# Patient Record
Sex: Female | Born: 2000 | Race: Black or African American | Hispanic: No | Marital: Single | Smoking: Never smoker
Health system: Southern US, Community
[De-identification: ages and names within clinical notes are randomized; demographics above are authoritative.]

---

## 2021-01-05 ENCOUNTER — Emergency Department (HOSPITAL_COMMUNITY): Payer: No Typology Code available for payment source

## 2021-01-05 ENCOUNTER — Emergency Department (HOSPITAL_COMMUNITY)
Admission: EM | Admit: 2021-01-05 | Discharge: 2021-01-05 | Disposition: A | Discharge status: Home / Self Care | Payer: No Typology Code available for payment source | Attending: Emergency Medicine | Admitting: Emergency Medicine

## 2021-01-05 DIAGNOSIS — Y9289 Other specified places as the place of occurrence of the external cause: Secondary | ICD-10-CM | POA: Insufficient documentation

## 2021-01-05 DIAGNOSIS — M542 Cervicalgia: Secondary | ICD-10-CM | POA: Diagnosis not present

## 2021-01-05 DIAGNOSIS — S060X0A Concussion without loss of consciousness, initial encounter: Secondary | ICD-10-CM | POA: Diagnosis not present

## 2021-01-05 DIAGNOSIS — S0990XA Unspecified injury of head, initial encounter: Secondary | ICD-10-CM

## 2021-01-05 DIAGNOSIS — Y990 Civilian activity done for income or pay: Secondary | ICD-10-CM | POA: Insufficient documentation

## 2021-01-05 DIAGNOSIS — Y9389 Activity, other specified: Secondary | ICD-10-CM | POA: Insufficient documentation

## 2021-01-05 DIAGNOSIS — W01198A Fall on same level from slipping, tripping and stumbling with subsequent striking against other object, initial encounter: Secondary | ICD-10-CM | POA: Insufficient documentation

## 2021-01-05 LAB — POC URINE PREG, ED: Preg Test, Ur: NEGATIVE

## 2021-01-05 MED ORDER — ACETAMINOPHEN 500 MG PO TABS: 1000.0000 mg | ORAL_TABLET | Freq: Once | ORAL | Status: DC

## 2021-01-05 NOTE — Discharge Instructions (Signed)
You have been evaluated for your head injury.  Fortunately CT scan and Xray did not show any broken bones or blood in your brain.  You do have symptoms concerning for a concussion.  Please follow instruction below.  You may take over the counter tylenol or ibuprofen as needed for pain. Return if you have any concerns.

## 2021-01-05 NOTE — ED Triage Notes (Signed)
Pt c/o continued HA, back & neck pain after being hit in the back of the head by approx 90lb crate. Pt denies LOC, endorses nausea & 1 episode of emesis PTA, photosensitivity, sensitive to sound.  10/10 HA

## 2021-01-05 NOTE — ED Provider Notes (Signed)
Emergency Medicine Provider Triage Evaluation Note  Kayla Clay , a 20 y.o. female  was evaluated in triage.  Pt complains of headache.  Review of Systems  Positive: Headache, n/v, light&sound sensitivity, neck pain Negative: Fever, numbness, weaknesss  Physical Exam  BP 135/84 (BP Location: Right Arm)   Pulse 74   Temp 98.2 F (36.8 C) (Oral)   Resp 18   SpO2 100%  Gen:   Awake, no distress   Resp:  Normal effort  MSK:   Moves extremities without difficulty  Other:  TTP to R parietal region, no bruising or crepitus.  Ttp to R paracervical spinal muscle  Medical Decision Making  Medically screening exam initiated at 1:33 AM.  Appropriate orders placed.  [facility] was informed that the remainder of the evaluation will be completed by another provider, this initial triage assessment does not replace that evaluation, and the importance of remaining in the ED until their evaluation is complete.  Report being struck in the head with a falling crate approximately 90lbs.  It was a glance to the R side of head while at work and her supervisor was able to catch it from falling.  However endorse 10/10 R side head/neck pain, nausea, one episode of emesis along with light/sound sensitivity.     Fayrene Helper, PA-C 01/05/21 0141    Zadie Rhine, MD 01/05/21 [phone]

## 2021-01-05 NOTE — ED Provider Notes (Signed)
[facility] EMERGENCY DEPARTMENT Provider Note   CSN: [id number] Arrival date & time: 01/05/21  0120     History Chief Complaint  Patient presents with   Head Injury    [facility] Spillers is a 20 y.o. female.  The history is provided by the patient. No language interpreter was used.  Head Injury   20 year old female presenting for evaluation of head injury.  Pt report earlier in the day while a work a 95lbs crates tipped over and struck her head.  Her supervisor was able to catch it from falling but the edge of the crate struck the right side of her head.  Since then pt has has pain to the affected area.  Described as sharp, throbbing, 10/10 with associate nausea, one near emesis, and having light/sound sensitivity.  REport mild pain to R side of neck.  No confusion, numbness or weakness.  SHe is currently not pregnant.    No past medical history on file.  There are no problems to display for this patient.   The histories are not reviewed yet. Please review them in the "History" navigator section and refresh this SmartLink.   OB History   No obstetric history on file.     No family history on file.     Home Medications Prior to Admission medications   Not on File    Allergies    Patient has no allergy information on record.  Review of Systems   Review of Systems  All other systems reviewed and are negative.  Physical Exam Updated Vital Signs BP 135/84 (BP Location: Right Arm)   Pulse 74   Temp 98.2 F (36.8 C) (Oral)   Resp 18   SpO2 100%   Physical Exam Vitals and nursing note reviewed.  Constitutional:      General: She is not in acute distress.    Appearance: She is well-developed.  HENT:     Head: Normocephalic and atraumatic.     Comments: TTP to R parietal scalp without bruising, swelling or laceration noted.  Neg Racoon's eyes or Battle sign, no midface tenderness Eyes:     Conjunctiva/sclera: Conjunctivae normal.   Pulmonary:     Effort: Pulmonary effort is normal.  Musculoskeletal:     Cervical back: Normal range of motion. Tenderness (mild tenderness to R paracervical spinal muscle with FROM) present. No rigidity.  Skin:    Findings: No rash.  Neurological:     Mental Status: She is alert and oriented to person, place, and time.     GCS: GCS eye subscore is 4. GCS verbal subscore is 5. GCS motor subscore is 6.     Cranial Nerves: Cranial nerves are intact. No cranial nerve deficit, dysarthria or facial asymmetry.     Sensory: Sensation is intact.     Motor: Motor function is intact.  Psychiatric:        Mood and Affect: Mood normal.    ED Results / Procedures / Treatments   Labs (all labs ordered are listed, but only abnormal results are displayed) Labs Reviewed  POC URINE PREG, ED    EKG None  Radiology DG Cervical Spine 2-3 Views  Result Date: 01/05/2021 CLINICAL DATA:  Back and neck pain after being struck in the back of the head vein 90 pound crate EXAM: CERVICAL SPINE - 2-3 VIEW COMPARISON:  None. FINDINGS: Mild straightening of the cervical lordosis, a positional related to muscle spasm. No visible acute cervical spine fracture  or traumatic listhesis. Dens is obscured on the frontal view. No prevertebral swelling or gas. Airways patent. Normal bone mineralization. No suspicious lytic or blastic lesions. No acute abnormality in the upper chest or imaged lung apices. IMPRESSION: No visible acute cervical spine fracture or traumatic listhesis. Dens is partially obscured on frontal view. Please note: Spine radiography may have limited sensitivity and specificity in the setting of significant trauma. If there is significant mechanism and persisting clinical concern, recommend low threshold for CT imaging. Electronically Signed   By: Kreg Shropshire M.D.   On: 01/05/2021 02:27   CT Head Wo Contrast  Result Date: 01/05/2021 CLINICAL DATA:  Head trauma, headache after being struck by a 90 pound  crate in the back of the head. Denies loss of consciousness, nausea and emesis prior to admission. Photosensitivity, sound sensitivity EXAM: CT HEAD WITHOUT CONTRAST TECHNIQUE: Contiguous axial images were obtained from the base of the skull through the vertex without intravenous contrast. COMPARISON:  None. FINDINGS: Brain: No evidence of acute infarction, hemorrhage, hydrocephalus, extra-axial collection, visible mass lesion or mass effect. Vascular: No hyperdense vessel or unexpected calcification. Skull: No posterior scalp swelling or hematoma. No calvarial fracture other worrisome osseous lesion. Sinuses/Orbits: Paranasal sinuses and mastoid air cells are predominantly clear. Included orbital structures are unremarkable. Other: None. IMPRESSION: No acute intracranial abnormality. No significant scalp swelling or hematoma. Electronically Signed   By: Kreg Shropshire M.D.   On: 01/05/2021 02:30    Procedures Procedures   Medications Ordered in ED Medications  acetaminophen (TYLENOL) tablet 1,000 mg (has no administration in time range)    ED Course  I have reviewed the triage vital signs and the nursing notes.  Pertinent labs & imaging results that were available during my care of the patient were reviewed by me and considered in my medical decision making (see chart for details).    MDM Rules/Calculators/A&P                          BP 135/84 (BP Location: Right Arm)   Pulse 74   Temp 98.2 F (36.8 C) (Oral)   Resp 18   SpO2 100%   Final Clinical Impression(s) / ED Diagnoses Final diagnoses:  Minor head injury, initial encounter  Concussion without loss of consciousness, initial encounter    Rx / DC Orders ED Discharge Orders     None      3:29 AM Pt here with minor head injury and concussive sxs.  CT of head and xray of Cspine unremarkable.  Will provide appropriate treatment and care.  Return precaution given.    Fayrene Helper, PA-C 01/05/21 0335    Zadie Rhine,  MD 01/05/21 [phone]

## 2021-05-31 ENCOUNTER — Encounter (HOSPITAL_COMMUNITY): Payer: Self-pay

## 2021-05-31 ENCOUNTER — Other Ambulatory Visit: Payer: Self-pay

## 2021-05-31 ENCOUNTER — Emergency Department (HOSPITAL_COMMUNITY): Payer: BC Managed Care – PPO

## 2021-05-31 ENCOUNTER — Emergency Department (HOSPITAL_COMMUNITY)
Admission: EM | Admit: 2021-05-31 | Discharge: 2021-05-31 | Disposition: A | Discharge status: Home / Self Care | Payer: BC Managed Care – PPO | Attending: Emergency Medicine | Admitting: Emergency Medicine

## 2021-05-31 DIAGNOSIS — R200 Anesthesia of skin: Secondary | ICD-10-CM | POA: Diagnosis present

## 2021-05-31 DIAGNOSIS — Z5321 Procedure and treatment not carried out due to patient leaving prior to being seen by health care provider: Secondary | ICD-10-CM | POA: Insufficient documentation

## 2021-05-31 DIAGNOSIS — M545 Low back pain, unspecified: Secondary | ICD-10-CM | POA: Diagnosis not present

## 2021-05-31 MED ORDER — HYDROCODONE-ACETAMINOPHEN 5-325 MG PO TABS: 1.0000 | ORAL_TABLET | Freq: Once | ORAL | Status: DC

## 2021-05-31 NOTE — ED Notes (Signed)
Pt stated that she would be leaving. Seen leaving the ED.

## 2021-05-31 NOTE — ED Provider Notes (Addendum)
Emergency Medicine Provider Triage Evaluation Note  Kayla Clay , a 20 y.o. female  was evaluated in triage.  Pt complains of lower back pain with radiation into the leg and hip started on Monday.  No urinary symptoms.  No weakness to the lower extremity.  No bladder or bowel incontinence.  Review of Systems  Positive:  Negative: See above  Physical Exam  BP 121/74 (BP Location: Right Arm)   Pulse 79   Temp 98.3 F (36.8 C) (Oral)   Resp 16   Ht 5\' 6"  (1.676 m)   Wt 94.8 kg   SpO2 99%   BMI 33.73 kg/m  Gen:   Awake, no distress   Resp:  Normal effort  MSK:   Moves extremities without difficulty  Other:  Midline lumbar tenderness.  Left paralumbar tenderness  Medical Decision Making  Medically screening exam initiated at 6:38 PM.  Appropriate orders placed.  [facility] [place] was informed that the remainder of the evaluation will be completed by another provider, this initial triage assessment does not replace that evaluation, and the importance of remaining in the ED until their evaluation is complete.  Patient denies pregnancy test.   Ladona Ridgel, PA-C 05/31/21 1840    14/08/22 [place], PA-C 05/31/21 1842    14/08/22, MD 06/01/21 1620

## 2021-05-31 NOTE — ED Triage Notes (Signed)
Pt here for hip numbness that's been ongoing since Monday. Pt states that pain radiates to lower back. Pt did nothing to precede the pain, but woke up with the numbness.

## 2021-06-01 ENCOUNTER — Ambulatory Visit
Admission: RE | Admit: 2021-06-01 | Discharge status: Against Medical Advice | Payer: BC Managed Care – PPO | Source: Ambulatory Visit

## 2022-12-24 IMAGING — DX DG LUMBAR SPINE COMPLETE 4+V
5 series · 5 of 5 positions shown · non-contrast
Comparison: None.

CLINICAL DATA: Lumbar tenderness

EXAM:
LUMBAR SPINE - COMPLETE 4+ VIEW

[w lumbar spine ap]
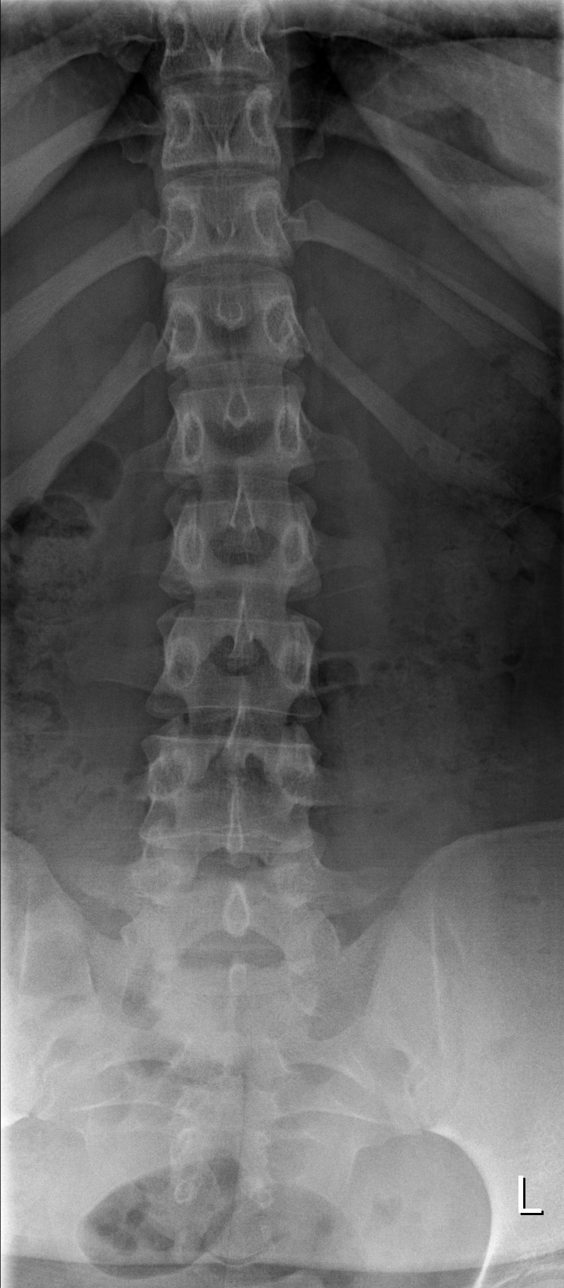

[w lumbar spine obl (1 of 2)]
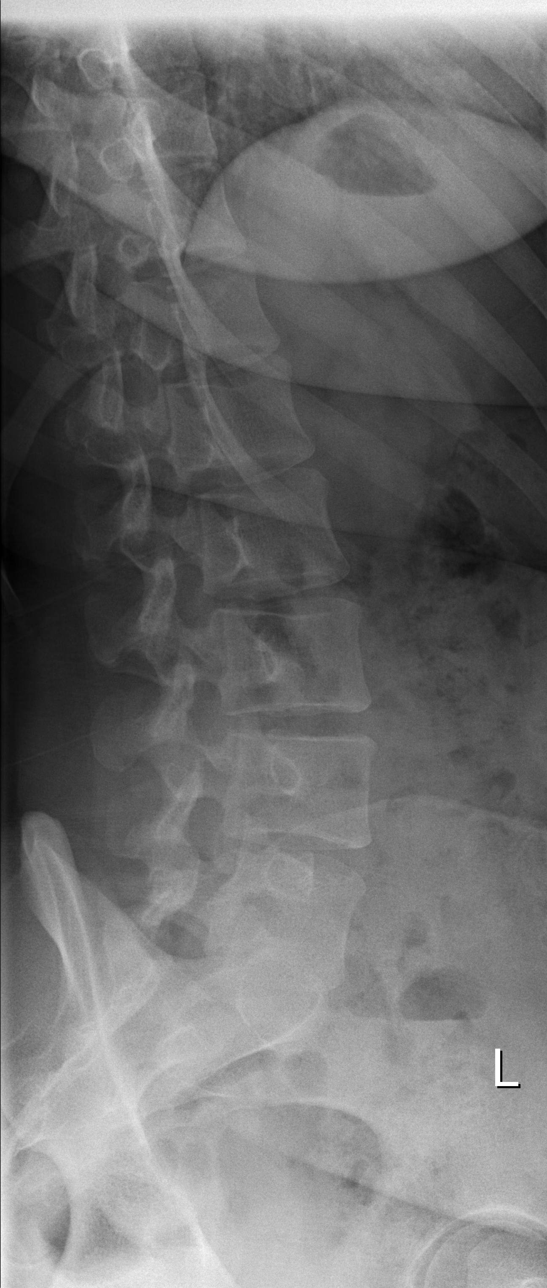

[w lumbar spine obl (2 of 2)]
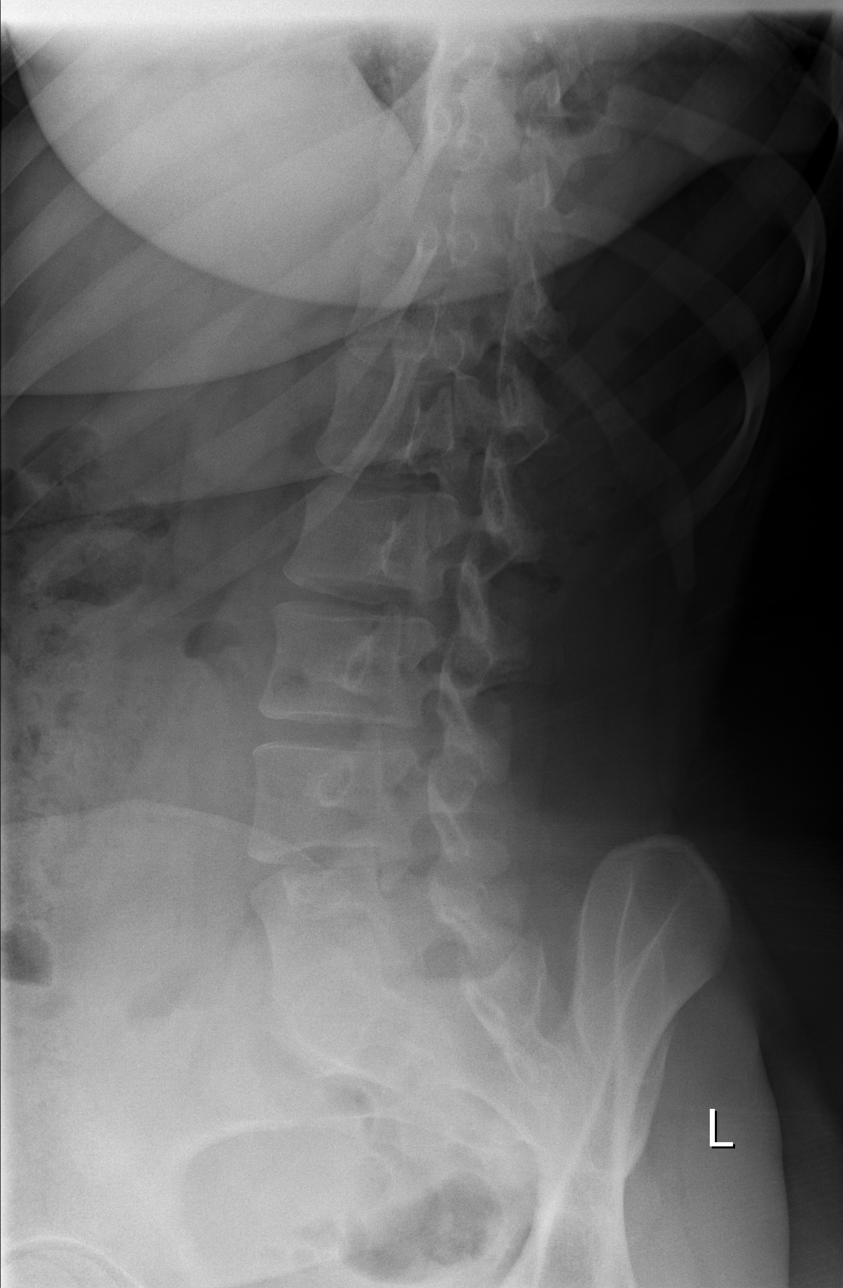

[w lumbar spine lat (1 of 2)]
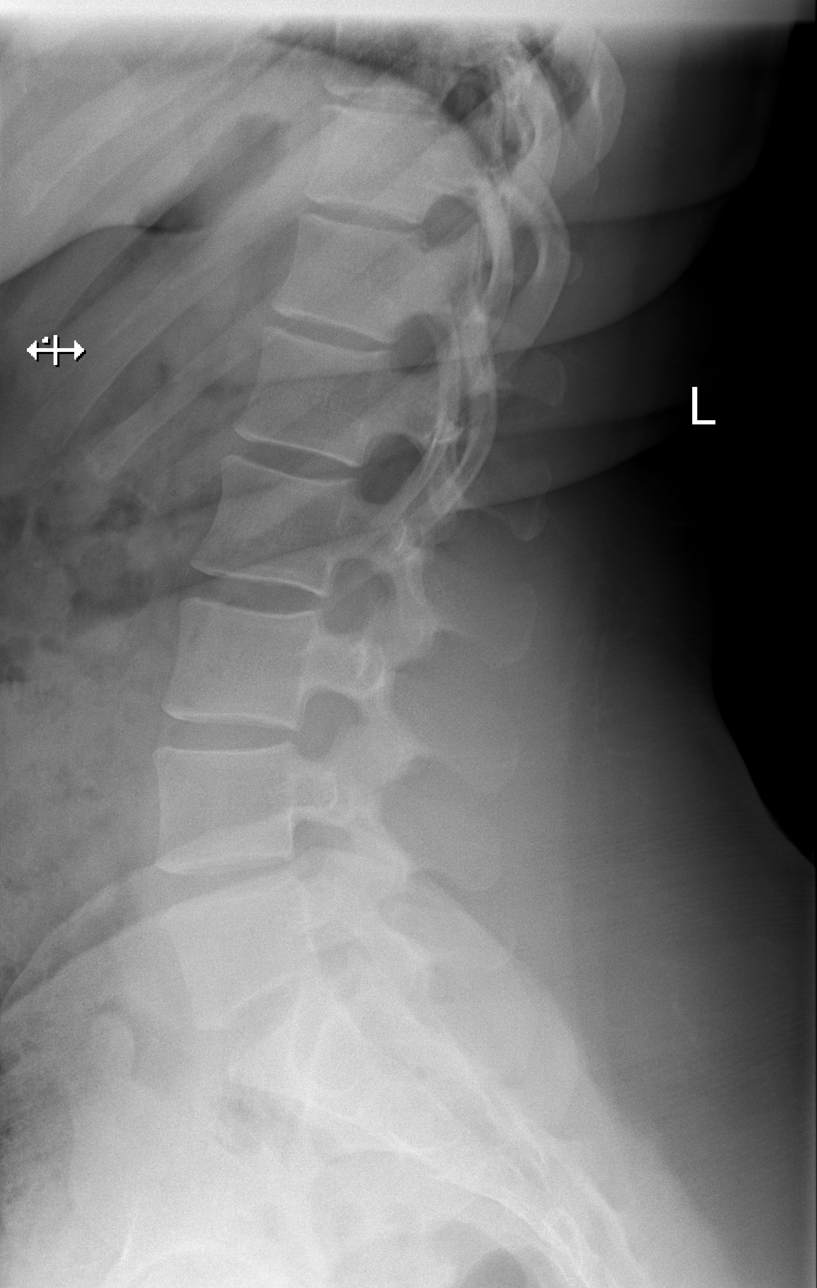

[w lumbar spine lat (2 of 2)]
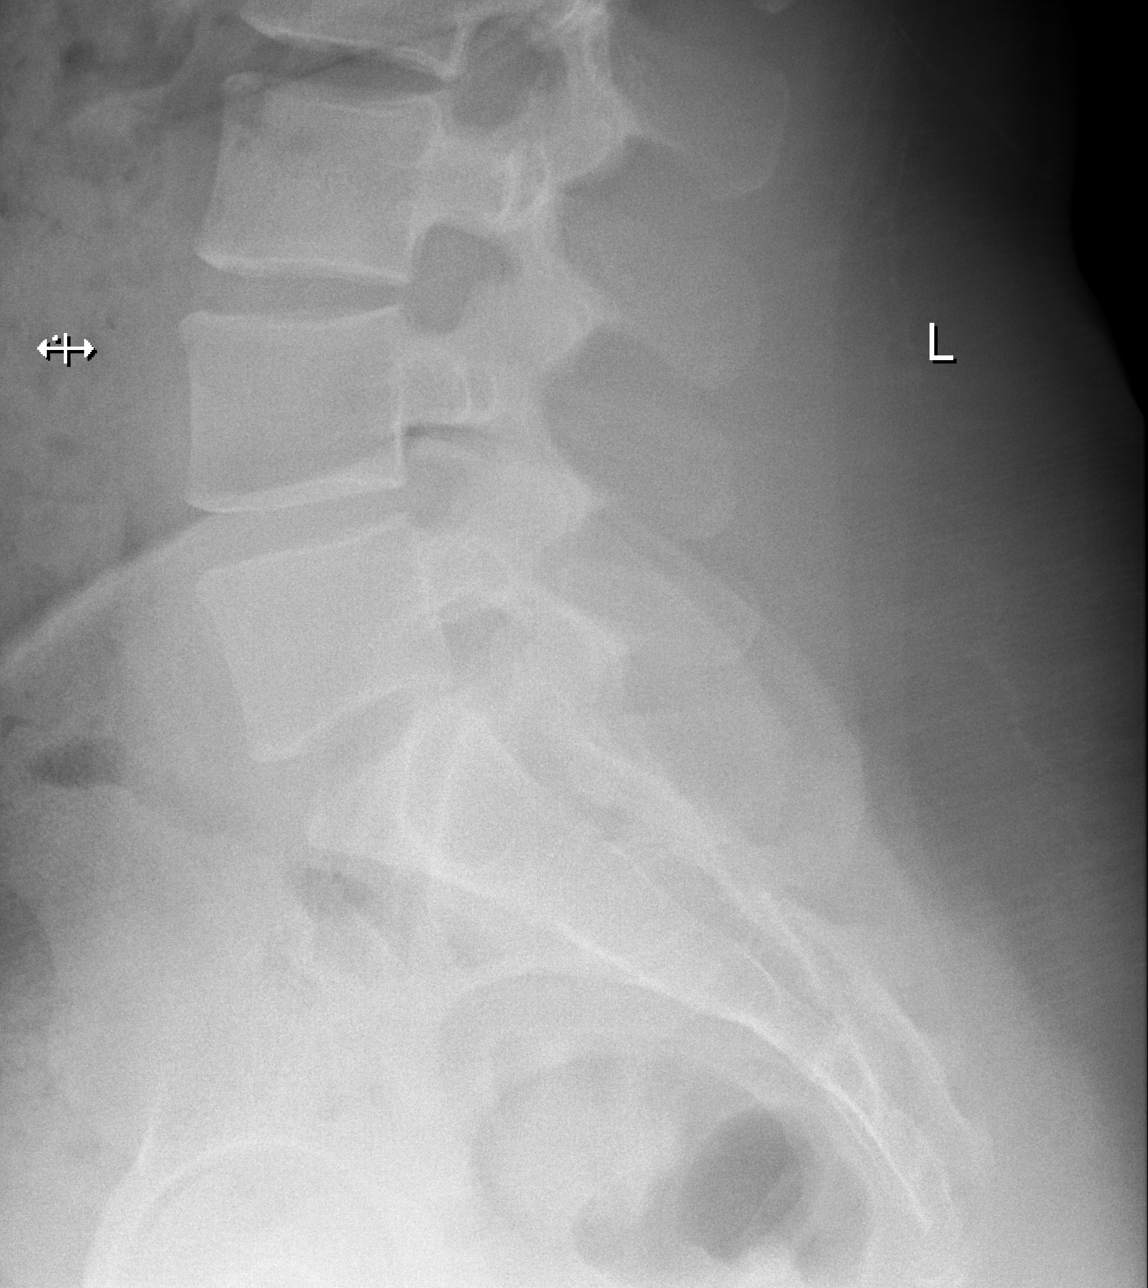

[5 of 5 positions shown; findings below may reference images not displayed]

FINDINGS: There is no evidence of lumbar spine fracture. Alignment is normal.
Intervertebral disc spaces are maintained.
IMPRESSION: Negative.

## 2023-09-11 ENCOUNTER — Other Ambulatory Visit: Payer: Self-pay

## 2023-09-11 ENCOUNTER — Emergency Department (HOSPITAL_COMMUNITY)
Admission: EM | Admit: 2023-09-11 | Discharge: 2023-09-11 | Disposition: A | Discharge status: Home / Self Care | Payer: Self-pay | Attending: Emergency Medicine | Admitting: Emergency Medicine

## 2023-09-11 DIAGNOSIS — S31825A Open bite of left buttock, initial encounter: Secondary | ICD-10-CM | POA: Insufficient documentation

## 2023-09-11 DIAGNOSIS — W540XXA Bitten by dog, initial encounter: Secondary | ICD-10-CM | POA: Insufficient documentation

## 2023-09-11 MED ORDER — AMOXICILLIN-POT CLAVULANATE 875-125 MG PO TABS: 1.0000 | ORAL_TABLET | Freq: Two times a day (BID) | ORAL | 0 refills | Status: AC

## 2023-09-11 NOTE — ED Triage Notes (Signed)
 Patient report dog bite on her left butt cheek. Patient report its her neighbors dog and its fully vaccinated. Bleeding Controlled.

## 2023-09-11 NOTE — ED Provider Notes (Signed)
  [place] EMERGENCY DEPARTMENT AT [facility] Provider Note   CSN: [id number] Arrival date & time: 09/11/23  1511     History  Chief Complaint  Patient presents with   Animal Bite    Kayla Clay is a 23 y.o. female.   Animal Bite Patient with dog bite to left buttock.  Occurred 2 days ago.  It is a known dog that the neighbor owns.  Immunizations reportedly up-to-date.  Patient's tetanus up-to-date.  Patient states she is worried because it has had some drainage still.  No fevers.  Does have pain with sitting.     Home Medications Prior to Admission medications   Medication Sig Start Date End Date Taking? Authorizing Provider  amoxicillin-clavulanate (AUGMENTIN) 875-125 MG tablet Take 1 tablet by mouth every 12 (twelve) hours. 09/11/23  Yes Benjiman Core, MD      Allergies    Patient has no allergy information on record.    Review of Systems   Review of Systems  Physical Exam Updated Vital Signs BP 124/89   Pulse 84   Temp 98 F (36.7 C)   Resp 16   SpO2 99%  Physical Exam Vitals and nursing note reviewed.  Skin:    Comments: 2 puncture wounds on left buttock/upper thigh.  Some firmness at the site but no real erythema.  No active drainage.  Neurological:     Mental Status: She is alert and oriented to person, place, and time.     ED Results / Procedures / Treatments   Labs (all labs ordered are listed, but only abnormal results are displayed) Labs Reviewed - No data to display  EKG None  Radiology No results found.  Procedures Procedures    Medications Ordered in ED Medications - No data to display  ED Course/ Medical Decision Making/ A&P                                 Medical Decision Making Risk Prescription drug management.   Patient with dog bite to buttocks.  Does have some firmness below it.  States has been some drainage.  Tetanus is up-to-date.  Known dog do not think rabies needs updating.  However with  the firmness below the bite and some drainage previously we will treat with a short course antibiotics.  Appears stable for discharge home.        Final Clinical Impression(s) / ED Diagnoses Final diagnoses:  Dog bite, initial encounter    Rx / DC Orders ED Discharge Orders          Ordered    amoxicillin-clavulanate (AUGMENTIN) 875-125 MG tablet  Every 12 hours        09/11/23 1735              Benjiman Core, MD 09/11/23 [phone]

## 2023-09-11 NOTE — Discharge Instructions (Addendum)
 Warm soaks may help.  Watch for redness or increasing pain.

## 2024-05-06 ENCOUNTER — Emergency Department (HOSPITAL_BASED_OUTPATIENT_CLINIC_OR_DEPARTMENT_OTHER): Payer: Self-pay

## 2024-05-06 ENCOUNTER — Encounter (HOSPITAL_BASED_OUTPATIENT_CLINIC_OR_DEPARTMENT_OTHER): Payer: Self-pay | Admitting: Emergency Medicine

## 2024-05-06 ENCOUNTER — Emergency Department (HOSPITAL_BASED_OUTPATIENT_CLINIC_OR_DEPARTMENT_OTHER)
Admission: EM | Admit: 2024-05-06 | Discharge: 2024-05-06 | Disposition: A | Discharge status: Home / Self Care | Payer: Self-pay | Attending: Emergency Medicine | Admitting: Emergency Medicine

## 2024-05-06 DIAGNOSIS — R202 Paresthesia of skin: Secondary | ICD-10-CM | POA: Insufficient documentation

## 2024-05-06 LAB — CBC WITH DIFFERENTIAL/PLATELET
Abs Immature Granulocytes: 0.02 K/uL (ref 0.00–0.07)
Basophils Absolute: 0 K/uL (ref 0.0–0.1)
Basophils Relative: 1 %
Eosinophils Absolute: 0.2 K/uL (ref 0.0–0.5)
Eosinophils Relative: 3 %
HCT: 38.9 % (ref 36.0–46.0)
Hemoglobin: 12.9 g/dL (ref 12.0–15.0)
Immature Granulocytes: 0 %
Lymphocytes Relative: 40 %
Lymphs Abs: 3.4 K/uL (ref 0.7–4.0)
MCH: 28.4 pg (ref 26.0–34.0)
MCHC: 33.2 g/dL (ref 30.0–36.0)
MCV: 85.5 fL (ref 80.0–100.0)
Monocytes Absolute: 0.6 K/uL (ref 0.1–1.0)
Monocytes Relative: 7 %
Neutro Abs: 4.1 K/uL (ref 1.7–7.7)
Neutrophils Relative %: 49 %
Platelets: 326 K/uL (ref 150–400)
RBC: 4.55 MIL/uL (ref 3.87–5.11)
RDW: 12.7 % (ref 11.5–15.5)
WBC: 8.3 K/uL (ref 4.0–10.5)
nRBC: 0 % (ref 0.0–0.2)

## 2024-05-06 LAB — COMPREHENSIVE METABOLIC PANEL WITH GFR
ALT: 10 U/L (ref 0–44)
AST: 17 U/L (ref 15–41)
Albumin: 4.6 g/dL (ref 3.5–5.0)
Alkaline Phosphatase: 57 U/L (ref 38–126)
Anion gap: 11 (ref 5–15)
BUN: 9 mg/dL (ref 6–20)
CO2: 24 mmol/L (ref 22–32)
Calcium: 9.7 mg/dL (ref 8.9–10.3)
Chloride: 103 mmol/L (ref 98–111)
Creatinine, Ser: 0.67 mg/dL (ref 0.44–1.00)
GFR, Estimated: >60 mL/min (ref >60)
Glucose, Bld: 87 mg/dL (ref 70–99)
Potassium: 3.9 mmol/L (ref 3.5–5.1)
Sodium: 138 mmol/L (ref 135–145)
Total Bilirubin: 0.4 mg/dL (ref 0.0–1.2)
Total Protein: 8.4 g/dL — ABNORMAL HIGH (ref 6.5–8.1)

## 2024-05-06 LAB — PREGNANCY, URINE: Preg Test, Ur: NEGATIVE

## 2024-05-06 LAB — TSH: TSH: 1.99 u[IU]/mL (ref 0.350–4.500)

## 2024-05-06 LAB — FOLATE: Folate: 10.8 ng/mL (ref >5.9)

## 2024-05-06 LAB — TROPONIN T, HIGH SENSITIVITY: Troponin T High Sensitivity: <15 ng/L (ref 0–19)

## 2024-05-06 LAB — VITAMIN B12: Vitamin B-12: 468 pg/mL (ref 180–914)

## 2024-05-06 NOTE — ED Provider Notes (Signed)
 [place] EMERGENCY DEPARTMENT AT [facility] Provider Note   CSN: [id number] Arrival date & time: 05/06/24  9449     Patient presents with: No chief complaint on file.   Kayla Clay is a 23 y.o. female.   Patient presents to the emergency department for evaluation of sensory problems.  Patient reports that for the last few weeks she has been having numbness and tingling.  Patient reports that she first felt it in her feet.  She describes it as a pins and needle feeling.  Over the last couple of days her feet have started to improve but now she has similar pins and needle, numbness sensation in both of her hands.  This has been persistent for about a week.  She has not had any headache, speech difficulty.  No vision change.       Prior to Admission medications   Medication Sig Start Date End Date Taking? Authorizing Provider  amoxicillin -clavulanate (AUGMENTIN ) 875-125 MG tablet Take 1 tablet by mouth every 12 (twelve) hours. 09/11/23   Patsey Lot, MD    Allergies: Patient has no known allergies.    Review of Systems  Updated Vital Signs BP 119/82 (BP Location: Right Arm)   Pulse 92   Temp 98.3 F (36.8 C) (Oral)   Resp 16   Ht 5' 5 (1.651 m)   Wt 83.1 kg   LMP 04/21/2024 (Exact Date)   SpO2 100%   BMI 30.49 kg/m   Physical Exam Vitals and nursing note reviewed.  Constitutional:      General: She is not in acute distress.    Appearance: She is well-developed.  HENT:     Head: Normocephalic and atraumatic.     Mouth/Throat:     Mouth: Mucous membranes are moist.  Eyes:     General: Vision grossly intact. Gaze aligned appropriately.     Extraocular Movements: Extraocular movements intact.     Conjunctiva/sclera: Conjunctivae normal.  Cardiovascular:     Rate and Rhythm: Normal rate and regular rhythm.     Pulses: Normal pulses.     Heart sounds: Normal heart sounds, S1 normal and S2 normal. No murmur heard.    No friction rub. No  gallop.  Pulmonary:     Effort: Pulmonary effort is normal. No respiratory distress.     Breath sounds: Normal breath sounds.  Abdominal:     General: Bowel sounds are normal.     Palpations: Abdomen is soft.     Tenderness: There is no abdominal tenderness. There is no guarding or rebound.     Hernia: No hernia is present.  Musculoskeletal:        General: No swelling.     Cervical back: Full passive range of motion without pain, normal range of motion and neck supple. No spinous process tenderness or muscular tenderness. Normal range of motion.     Right lower leg: No edema.     Left lower leg: No edema.  Skin:    General: Skin is warm and dry.     Capillary Refill: Capillary refill takes less than 2 seconds.     Findings: No ecchymosis, erythema, rash or wound.  Neurological:     General: No focal deficit present.     Mental Status: She is alert and oriented to person, place, and time.     GCS: GCS eye subscore is 4. GCS verbal subscore is 5. GCS motor subscore is 6.     Cranial Nerves: Cranial  nerves 2-12 are intact.     Sensory: Sensation is intact.     Motor: Motor function is intact.     Coordination: Coordination is intact.     Deep Tendon Reflexes:     Reflex Scores:      Patellar reflexes are 3+ on the right side and 3+ on the left side. Psychiatric:        Attention and Perception: Attention normal.        Mood and Affect: Mood normal.        Speech: Speech normal.        Behavior: Behavior normal.     (all labs ordered are listed, but only abnormal results are displayed) Labs Reviewed  CBC WITH DIFFERENTIAL/PLATELET  COMPREHENSIVE METABOLIC PANEL WITH GFR  TSH  VITAMIN B12  FOLATE  PREGNANCY, URINE    EKG: None  Radiology: No results found.   Procedures   Medications Ordered in the ED - No data to display                                  Medical Decision Making Amount and/or Complexity of Data Reviewed Labs: ordered. Radiology:  ordered.   Differential diagnosis considered includes, but not limited to:  TIA; Stroke; seizure; Complex Migraine; metabolic abnormality; vitamin deficiency; multiple sclerosis; Guillain-Barr  Patient presents to the emergency department for evaluation of numbness and tingling described as pins-and-needles in hands and feet.  No history of diabetes.  No family history of neurologic disorder such as multiple sclerosis.  Patient has had a couple of episodes of back pain but no consistent back pain that would explain the symptoms.  Patient has normal strength and her reflexes are brisk in lower extremities.  Although symptoms began in her feet, this seems to rule out Guillain-Barr.  Orders for lab work placed.  Will check for metabolic and electrolyte abnormality.  CT head will be performed to perform screening exam for CNS abnormality.  Will likely sign out to oncoming ER physician.  Patient may need neurologic evaluation, possible transfer for additional testing if felt necessary.     Final diagnoses:  Paresthesias    ED Discharge Orders     None          Haze Lonni PARAS, MD 05/06/24 [phone]

## 2024-05-06 NOTE — ED Provider Notes (Signed)
 I took over care of this patient at 7 AM  Physical Exam  BP 121/79   Pulse 69   Temp 98.3 F (36.8 C) (Oral)   Resp (!) 21   Ht 5' 5 (1.651 m)   Wt 83.1 kg   LMP 04/21/2024 (Exact Date)   SpO2 100%   BMI 30.49 kg/m   Physical Exam Vitals and nursing note reviewed.  Constitutional:      General: She is not in acute distress.    Appearance: She is well-developed.  HENT:     Head: Normocephalic and atraumatic.  Eyes:     Conjunctiva/sclera: Conjunctivae normal.  Cardiovascular:     Rate and Rhythm: Normal rate and regular rhythm.     Heart sounds: No murmur heard. Pulmonary:     Effort: Pulmonary effort is normal. No respiratory distress.     Breath sounds: Normal breath sounds.  Abdominal:     Palpations: Abdomen is soft.     Tenderness: There is no abdominal tenderness.  Musculoskeletal:        General: No swelling.     Cervical back: Neck supple.  Skin:    General: Skin is warm and dry.     Capillary Refill: Capillary refill takes less than 2 seconds.  Neurological:     Mental Status: She is alert.     Comments: Does over left hand compared to right subjectively   Psychiatric:        Mood and Affect: Mood normal.     Procedures  Procedures  ED Course / MDM    Medical Decision Making Patient signed out to me at 7 AM.  She is stable, relatively normal lab workup and CT head.  Offered her transfer and admission for MRI and neurowork-up which she declined and would like to go home.  She does seem to have some numbness over her left hand when compared to right but none on the feet.  I explained to her that MS is in the differential and she needs to obtain close neuro follow-up.  I placed a neurology referral, and also advised her to call the office.  She was provided with the phone number.  Advised to [facility] primary care as well.  I advised her to return to the ER should she change her mind about imaging or admission and /or have any new or worsening  symptoms.  Problems Addressed: Paresthesias: undiagnosed new problem with uncertain prognosis  Amount and/or Complexity of Data Reviewed Labs:  Decision-making details documented in ED Course.    Details: Reviewed and showed no acute abnormality, TSH B12 and folate are pending Radiology:  Decision-making details documented in ED Course.    Details: Reviewed by me and shows no acute abnormality, CT head is negative ECG/medicine tests: independent interpretation performed. Decision-making details documented in ED Course.    Details: Inter by me in the absence of cardiology and shows sinus tachycardia, no significant change compared to prior  Risk OTC drugs. Prescription drug management. Decision regarding hospitalization.          Gennaro Duwaine CROME, DO 05/06/24 1242

## 2024-05-06 NOTE — ED Triage Notes (Signed)
 Pt states multiple body parts have been numb, started in feet and has migrated to chest. since  22nd of October, states yesterday hands turned blue. Having pain in chest and other numb areas.

## 2024-05-06 NOTE — Discharge Instructions (Addendum)
 It is important that you establish care with a primary care doctor and follow-up with them in 1 to 2 weeks.  Is also important that you call and follow-up with neurology in 1 to 2 weeks.  A referral was placed while you are in the emergency department.  Please call the number below.  If you change your mind or your symptoms get worse you can return to the ER for MRI and further workup.

## 2024-05-06 NOTE — ED Notes (Signed)
 Patient transported to CT
